# Patient Record
Sex: Female | Born: 1945 | Race: Black or African American | Hispanic: No | Smoking: Never smoker
Health system: Southern US, Community
[De-identification: ages and names within clinical notes are randomized; demographics above are authoritative.]

## PROBLEM LIST (undated history)

## (undated) DIAGNOSIS — Z950 Presence of cardiac pacemaker: Secondary | ICD-10-CM

## (undated) DIAGNOSIS — E119 Type 2 diabetes mellitus without complications: Secondary | ICD-10-CM

## (undated) DIAGNOSIS — N289 Disorder of kidney and ureter, unspecified: Secondary | ICD-10-CM

## (undated) HISTORY — PX: CARDIAC PACEMAKER PLACEMENT: SHX583

---

## 2016-03-13 ENCOUNTER — Encounter (HOSPITAL_BASED_OUTPATIENT_CLINIC_OR_DEPARTMENT_OTHER): Payer: Self-pay | Admitting: Emergency Medicine

## 2016-03-13 DIAGNOSIS — Z794 Long term (current) use of insulin: Secondary | ICD-10-CM | POA: Insufficient documentation

## 2016-03-13 DIAGNOSIS — M5432 Sciatica, left side: Secondary | ICD-10-CM | POA: Diagnosis not present

## 2016-03-13 DIAGNOSIS — G8929 Other chronic pain: Secondary | ICD-10-CM | POA: Diagnosis not present

## 2016-03-13 DIAGNOSIS — Z791 Long term (current) use of non-steroidal anti-inflammatories (NSAID): Secondary | ICD-10-CM | POA: Insufficient documentation

## 2016-03-13 DIAGNOSIS — Z7901 Long term (current) use of anticoagulants: Secondary | ICD-10-CM | POA: Diagnosis not present

## 2016-03-13 DIAGNOSIS — Z992 Dependence on renal dialysis: Secondary | ICD-10-CM | POA: Diagnosis not present

## 2016-03-13 DIAGNOSIS — N186 End stage renal disease: Secondary | ICD-10-CM | POA: Diagnosis not present

## 2016-03-13 DIAGNOSIS — M549 Dorsalgia, unspecified: Secondary | ICD-10-CM | POA: Diagnosis present

## 2016-03-13 DIAGNOSIS — Z79899 Other long term (current) drug therapy: Secondary | ICD-10-CM | POA: Insufficient documentation

## 2016-03-13 DIAGNOSIS — E119 Type 2 diabetes mellitus without complications: Secondary | ICD-10-CM | POA: Insufficient documentation

## 2016-03-13 DIAGNOSIS — M5431 Sciatica, right side: Secondary | ICD-10-CM | POA: Diagnosis not present

## 2016-03-13 LAB — CBG MONITORING, ED: GLUCOSE-CAPILLARY: 140 mg/dL — AB (ref 65–99)

## 2016-03-13 NOTE — ED Notes (Signed)
Patient reports that she is having a Headache and back ache that goes all up her back and her head. Patient reports that she was recently dx with spinal stenosis and went to her MD this am. The patient reports that she was told to come to the ER if it became worse

## 2016-03-14 ENCOUNTER — Emergency Department (HOSPITAL_BASED_OUTPATIENT_CLINIC_OR_DEPARTMENT_OTHER)
Admission: EM | Admit: 2016-03-14 | Discharge: 2016-03-14 | Disposition: A | Discharge status: Home / Self Care | Payer: Medicare Other | Attending: Emergency Medicine | Admitting: Emergency Medicine

## 2016-03-14 DIAGNOSIS — M5431 Sciatica, right side: Secondary | ICD-10-CM

## 2016-03-14 DIAGNOSIS — M5432 Sciatica, left side: Secondary | ICD-10-CM

## 2016-03-14 HISTORY — DX: Type 2 diabetes mellitus without complications: E11.9

## 2016-03-14 HISTORY — DX: Disorder of kidney and ureter, unspecified: N28.9

## 2016-03-14 HISTORY — DX: Presence of cardiac pacemaker: Z95.0

## 2016-03-14 MED ORDER — HYDROCODONE-ACETAMINOPHEN 5-325 MG PO TABS: 0.5000 | ORAL_TABLET | ORAL | Status: AC | PRN

## 2016-03-14 MED ORDER — FENTANYL CITRATE (PF) 100 MCG/2ML IJ SOLN
200.0000 ug | Freq: Once | INTRAMUSCULAR | Status: AC
  Administered 2016-03-14: 200 ug via INTRAMUSCULAR
  Filled 2016-03-14: qty 4

## 2016-03-14 NOTE — ED Provider Notes (Signed)
CSN: [id number]     Arrival date & time 03/13/16  2135 History   First MD Initiated Contact with Patient 03/14/16 0430     Chief Complaint  Patient presents with  . Back Pain     (Consider location/radiation/quality/duration/timing/severity/associated sxs/prior Treatment) HPI  This is a 70 year old female with a stage renal disease on hemodialysis. She has a history of spinal stenosis and chronic back pain with occasional "flareups". She is here with a flareup that began 4 days ago. The pain is most prominent in the lumbar region radiating down the backs of both legs as well as up the spine to her head. The pain is similar to previous exacerbations. The pain is severe enough that she is unable to ambulate; she does have a walker and a motorized wheelchair mobilization assistance. She was seen by her PCP yesterday and was started on steroids. She is not on any narcotic pain medication. She has had multiple back surgeries in the past and is not interested in neurosurgical referral.  Past Medical History  Diagnosis Date  . Renal disorder   . Diabetes mellitus without complication ([place])   . Pacemaker    Past Surgical History  Procedure Laterality Date  . Cardiac pacemaker placement     History reviewed. No pertinent family history. Social History  Substance Use Topics  . Smoking status: Never Smoker   . Smokeless tobacco: None  . Alcohol Use: No   OB History    No data available     Review of Systems  All other systems reviewed and are negative.   Allergies  Morphine and related  Home Medications   Prior to Admission medications   Medication Sig Start Date End Date Taking? Authorizing Provider  acetaminophen-codeine (TYLENOL #4) 300-60 MG tablet Take 1 tablet by mouth every 4 (four) hours as needed for pain.   Yes Historical Provider, MD  allopurinol (ZYLOPRIM) 100 MG tablet Take 100 mg by mouth daily.   Yes Historical Provider, MD  amiodarone (PACERONE) 200 MG tablet Take  200 mg by mouth daily.   Yes Historical Provider, MD  budesonide-formoterol (SYMBICORT) 160-4.5 MCG/ACT inhaler Inhale 2 puffs into the lungs 2 (two) times daily.   Yes Historical Provider, MD  camphor-menthol Timoteo Ace) lotion Apply 1 application topically as needed for itching.   Yes Historical Provider, MD  cyanocobalamin 1000 MCG tablet Take 1,000 mcg by mouth daily.   Yes Historical Provider, MD  diclofenac sodium (VOLTAREN) 1 % GEL Apply topically 4 (four) times daily.   Yes Historical Provider, MD  docusate sodium (COLACE) 100 MG capsule Take 100 mg by mouth 2 (two) times daily.   Yes Historical Provider, MD  gabapentin (NEURONTIN) 300 MG capsule Take 300 mg by mouth 3 (three) times daily.   Yes Historical Provider, MD  hydrALAZINE (APRESOLINE) 25 MG tablet Take 25 mg by mouth 3 (three) times daily.   Yes Historical Provider, MD  Insulin Glargine (TOUJEO SOLOSTAR [place]) Inject into the skin.   Yes Historical Provider, MD  levothyroxine (SYNTHROID, LEVOTHROID) 50 MCG tablet Take 50 mcg by mouth daily before breakfast.   Yes Historical Provider, MD  ondansetron (ZOFRAN-ODT) 4 MG disintegrating tablet Take 4 mg by mouth every 8 (eight) hours as needed for nausea or vomiting.   Yes Historical Provider, MD  pantoprazole (PROTONIX) 40 MG tablet Take 40 mg by mouth daily.   Yes Historical Provider, MD  polyethylene glycol (MIRALAX / GLYCOLAX) packet Take 17 g by mouth daily.   Yes  Historical Provider, MD  pravastatin (PRAVACHOL) 40 MG tablet Take 40 mg by mouth daily.   Yes Historical Provider, MD  predniSONE (DELTASONE) 10 MG tablet Take 20 mg by mouth daily with breakfast.   Yes Historical Provider, MD  ranolazine (RANEXA) 500 MG 12 hr tablet Take 500 mg by mouth 2 (two) times daily.   Yes Historical Provider, MD  sertraline (ZOLOFT) 25 MG tablet Take 25 mg by mouth daily.   Yes Historical Provider, MD  sevelamer carbonate (RENVELA) 800 MG tablet Take 800 mg by mouth 3 (three) times daily with meals.    Yes Historical Provider, MD  warfarin (COUMADIN) 5 MG tablet Take 5 mg by mouth daily.   Yes Historical Provider, MD  HYDROcodone-acetaminophen (NORCO/VICODIN) 5-325 MG tablet Take 0.5-1 tablets by mouth every 4 (four) hours as needed (for pain; may cause constipation). 03/14/16   Keidan Aumiller, MD   BP 173/78 mmHg  Pulse 78  Temp(Src) 98 F (36.7 C)  Resp 18  Ht 5\' 2"  (1.575 m)  Wt 189 lb (85.73 kg)  BMI 34.56 kg/m2  SpO2 100%   Physical Exam  General: Well-developed, well-nourished female in no acute distress; appearance consistent with age of record HENT: normocephalic; atraumatic Eyes: pupils equal, round and reactive to light; extraocular muscles intact; arcus senilis bilaterally Neck: supple Heart: regular rate and rhythm Lungs: clear to auscultation bilaterally Abdomen: soft; nondistended; nontender; bowel sounds present Extremities: No deformity; dialysis fistula left upper arm with pulse and thrill; decreased range of motion to hips due to pain Back: Positive straight leg raise bilaterally at about 10 Neurologic: Awake, alert and oriented; no facial droop; sensation intact and symmetric in lower extremities; motor function present in lower extremities but difficult to assess due to pain Skin: Warm and dry Psychiatric: Normal mood and affect    ED Course  Procedures (including critical care time)   MDM   Nursing notes and vitals signs, including pulse oximetry, reviewed.  Summary of this visit's results, reviewed by myself:  Labs:  Results for orders placed or performed during the hospital encounter of 03/14/16 (from the past 24 hour(s))  CBG monitoring, ED     Status: Abnormal   Collection Time: 03/13/16  9:45 PM  Result Value Ref Range   Glucose-Capillary 140 (H) 65 - 99 mg/dL    Final diagnoses:  Bilateral sciatica      Shanon Rosser, MD 03/14/16 [phone]

## 2016-03-14 NOTE — ED Notes (Signed)
Pt c/o back pain radiating to bilateral legs onset Saturday  States unable to stand,  Was see by her md yesterday and started on predisone

## 2020-01-06 ENCOUNTER — Encounter (HOSPITAL_COMMUNITY): Payer: Self-pay | Admitting: Emergency Medicine

## 2020-01-06 ENCOUNTER — Emergency Department (HOSPITAL_COMMUNITY): Payer: Medicare Other

## 2020-01-06 ENCOUNTER — Emergency Department (HOSPITAL_COMMUNITY)
Admission: EM | Admit: 2020-01-06 | Discharge: 2020-01-06 | Disposition: A | Discharge status: Home / Self Care | Payer: Medicare Other | Attending: Emergency Medicine | Admitting: Emergency Medicine

## 2020-01-06 ENCOUNTER — Other Ambulatory Visit: Payer: Self-pay

## 2020-01-06 DIAGNOSIS — Z7901 Long term (current) use of anticoagulants: Secondary | ICD-10-CM | POA: Diagnosis not present

## 2020-01-06 DIAGNOSIS — Z79899 Other long term (current) drug therapy: Secondary | ICD-10-CM | POA: Diagnosis not present

## 2020-01-06 DIAGNOSIS — U071 COVID-19: Secondary | ICD-10-CM | POA: Insufficient documentation

## 2020-01-06 DIAGNOSIS — R410 Disorientation, unspecified: Secondary | ICD-10-CM | POA: Insufficient documentation

## 2020-01-06 DIAGNOSIS — R52 Pain, unspecified: Secondary | ICD-10-CM

## 2020-01-06 DIAGNOSIS — N186 End stage renal disease: Secondary | ICD-10-CM | POA: Insufficient documentation

## 2020-01-06 DIAGNOSIS — E1122 Type 2 diabetes mellitus with diabetic chronic kidney disease: Secondary | ICD-10-CM | POA: Diagnosis not present

## 2020-01-06 DIAGNOSIS — Z89511 Acquired absence of right leg below knee: Secondary | ICD-10-CM | POA: Diagnosis not present

## 2020-01-06 DIAGNOSIS — E11649 Type 2 diabetes mellitus with hypoglycemia without coma: Secondary | ICD-10-CM | POA: Insufficient documentation

## 2020-01-06 DIAGNOSIS — Z992 Dependence on renal dialysis: Secondary | ICD-10-CM | POA: Diagnosis not present

## 2020-01-06 DIAGNOSIS — Z794 Long term (current) use of insulin: Secondary | ICD-10-CM | POA: Insufficient documentation

## 2020-01-06 DIAGNOSIS — E162 Hypoglycemia, unspecified: Secondary | ICD-10-CM

## 2020-01-06 DIAGNOSIS — R4182 Altered mental status, unspecified: Secondary | ICD-10-CM | POA: Diagnosis present

## 2020-01-06 LAB — CBG MONITORING, ED
Glucose-Capillary: 52 mg/dL — ABNORMAL LOW (ref 70–99)
Glucose-Capillary: 68 mg/dL — ABNORMAL LOW (ref 70–99)
Glucose-Capillary: 43 mg/dL — CL (ref 70–99)
Glucose-Capillary: 44 mg/dL — CL (ref 70–99)
Glucose-Capillary: 100 mg/dL — ABNORMAL HIGH (ref 70–99)
Glucose-Capillary: 77 mg/dL (ref 70–99)
Glucose-Capillary: 84 mg/dL (ref 70–99)
Glucose-Capillary: 86 mg/dL (ref 70–99)

## 2020-01-06 LAB — CBC WITH DIFFERENTIAL/PLATELET
Abs Immature Granulocytes: 0.03 10*3/uL (ref 0.00–0.07)
Basophils Absolute: 0 10*3/uL (ref 0.0–0.1)
Basophils Relative: 0 %
Eosinophils Absolute: 0.1 10*3/uL (ref 0.0–0.5)
Eosinophils Relative: 1 %
HCT: 32.9 % — ABNORMAL LOW (ref 36.0–46.0)
Hemoglobin: 9.6 g/dL — ABNORMAL LOW (ref 12.0–15.0)
Immature Granulocytes: 0 %
Lymphocytes Relative: 10 %
Lymphs Abs: 0.8 10*3/uL (ref 0.7–4.0)
MCH: 26.4 pg (ref 26.0–34.0)
MCHC: 29.2 g/dL — ABNORMAL LOW (ref 30.0–36.0)
MCV: 90.4 fL (ref 80.0–100.0)
Monocytes Absolute: 0.4 10*3/uL (ref 0.1–1.0)
Monocytes Relative: 6 %
Neutro Abs: 6.1 10*3/uL (ref 1.7–7.7)
Neutrophils Relative %: 83 %
Platelets: 175 10*3/uL (ref 150–400)
RBC: 3.64 MIL/uL — ABNORMAL LOW (ref 3.87–5.11)
RDW: 22.2 % — ABNORMAL HIGH (ref 11.5–15.5)
WBC: 7.4 10*3/uL (ref 4.0–10.5)
nRBC: 0 % (ref 0.0–0.2)

## 2020-01-06 LAB — RESPIRATORY PANEL BY RT PCR (FLU A&B, COVID)
Influenza A by PCR: NEGATIVE
Influenza B by PCR: NEGATIVE
SARS Coronavirus 2 by RT PCR: POSITIVE — AB

## 2020-01-06 LAB — HEPATIC FUNCTION PANEL
ALT: 11 U/L (ref 0–44)
AST: 32 U/L (ref 15–41)
Albumin: 2.3 g/dL — ABNORMAL LOW (ref 3.5–5.0)
Alkaline Phosphatase: 63 U/L (ref 38–126)
Bilirubin, Direct: 0.3 mg/dL — ABNORMAL HIGH (ref 0.0–0.2)
Indirect Bilirubin: 0.3 mg/dL (ref 0.3–0.9)
Total Bilirubin: 0.6 mg/dL (ref 0.3–1.2)
Total Protein: 5.7 g/dL — ABNORMAL LOW (ref 6.5–8.1)

## 2020-01-06 LAB — LIPASE, BLOOD: Lipase: 22 U/L (ref 11–51)

## 2020-01-06 LAB — BASIC METABOLIC PANEL
Anion gap: 12 (ref 5–15)
BUN: 26 mg/dL — ABNORMAL HIGH (ref 8–23)
CO2: 25 mmol/L (ref 22–32)
Calcium: 9.4 mg/dL (ref 8.9–10.3)
Chloride: 104 mmol/L (ref 98–111)
Creatinine, Ser: 7.44 mg/dL — ABNORMAL HIGH (ref 0.44–1.00)
GFR calc Af Amer: 6 mL/min — ABNORMAL LOW (ref >60)
GFR calc non Af Amer: 5 mL/min — ABNORMAL LOW (ref >60)
Glucose, Bld: 55 mg/dL — ABNORMAL LOW (ref 70–99)
Potassium: 5 mmol/L (ref 3.5–5.1)
Sodium: 141 mmol/L (ref 135–145)

## 2020-01-06 MED ORDER — DEXTROSE 50 % IV SOLN: 50.0000 mL | Freq: Once | INTRAVENOUS | Status: AC

## 2020-01-06 MED ORDER — DEXTROSE 50 % IV SOLN
INTRAVENOUS | Status: AC
  Filled 2020-01-06: qty 50

## 2020-01-06 MED ORDER — GLUCOSE 40 % PO GEL
ORAL | Status: AC
  Administered 2020-01-06: 11:00:00 37.5 g
  Filled 2020-01-06: qty 1

## 2020-01-06 MED ORDER — DEXTROSE 50 % IV SOLN
INTRAVENOUS | Status: AC
  Administered 2020-01-06: 11:00:00 50 mL via INTRAVENOUS
  Filled 2020-01-06: qty 50

## 2020-01-06 NOTE — Discharge Instructions (Signed)
Please do not give patient diabetic medications.  Her blood sugar was low and likely due to her medications.  Make sure she continues to eat and drink.  Talk with primary care doctor about continuing blood sugar medications.  Please check her blood sugar every several hours.

## 2020-01-06 NOTE — ED Notes (Signed)
Pt dc'd to NH, transported via [employer]

## 2020-01-06 NOTE — ED Provider Notes (Signed)
[facility] EMERGENCY DEPARTMENT Provider Note   CSN: [id number] Arrival date & time: 01/06/20  0846     History Chief Complaint  Patient presents with  . Altered Mental Status    Martha Murray is a 74 y.o. female.  Per nursing home patient became slightly more altered after morning medications this morning.  These medications did not include any diabetes medicine or pain medicine.  She appeared maybe not to be moving her right arm.  EMS found her to have a blood sugar of 69.  Gave her glucagon.  Upon EMS evaluation she is moving all of her extremities and talking but she appears mildly confused.  Patient does not make urine.  She has been on 3 L of oxygen since being diagnosed with coronavirus about 3 weeks ago.  Patient denies any chest pain, shortness of breath.  Patient is supposed to go to dialysis today.  The history is provided by the patient.  Altered Mental Status Presenting symptoms: confusion and partial responsiveness   Severity:  Mild Most recent episode:  Today Episode history:  Single Timing:  Constant Progression:  Improving Chronicity:  New Context comment:  ESRD on HD, Insulin dependent diabtes found lethargic by rehab staff. Blood sugar with EMS 69. Associated symptoms: no abdominal pain, no fever, no palpitations, no rash, no seizures and no vomiting        Past Medical History:  Diagnosis Date  . Diabetes mellitus without complication ([place])   . Pacemaker   . Renal disorder     There are no problems to display for this patient.   Past Surgical History:  Procedure Laterality Date  . CARDIAC PACEMAKER PLACEMENT       OB History   No obstetric history on file.     No family history on file.  Social History   Tobacco Use  . Smoking status: Never Smoker  . Smokeless tobacco: Never Used  Substance Use Topics  . Alcohol use: No  . Drug use: No    Home Medications Prior to Admission medications   Medication Sig Start Date  End Date Taking? Authorizing Provider  acetaminophen-codeine (TYLENOL #4) 300-60 MG tablet Take 1 tablet by mouth every 4 (four) hours as needed for pain.    [provider]  allopurinol (ZYLOPRIM) 100 MG tablet Take 100 mg by mouth daily.    [provider]  amiodarone (PACERONE) 200 MG tablet Take 200 mg by mouth daily.    [provider]  budesonide-formoterol (SYMBICORT) 160-4.5 MCG/ACT inhaler Inhale 2 puffs into the lungs 2 (two) times daily.    [provider]  camphor-menthol Timoteo Ace) lotion Apply 1 application topically as needed for itching.    [provider]  cyanocobalamin 1000 MCG tablet Take 1,000 mcg by mouth daily.    [provider]  diclofenac sodium (VOLTAREN) 1 % GEL Apply topically 4 (four) times daily.    [provider]  docusate sodium (COLACE) 100 MG capsule Take 100 mg by mouth 2 (two) times daily.    [provider]  gabapentin (NEURONTIN) 300 MG capsule Take 300 mg by mouth 3 (three) times daily.    [provider]  hydrALAZINE (APRESOLINE) 25 MG tablet Take 25 mg by mouth 3 (three) times daily.    [provider]  HYDROcodone-acetaminophen (NORCO/VICODIN) 5-325 MG tablet Take 0.5-1 tablets by mouth every 4 (four) hours as needed (for pain; may cause constipation). 03/14/16   Molpus, Jenny Reichmann, MD  Insulin Glargine (  TOUJEO SOLOSTAR [place]) Inject into the skin.    [provider]  levothyroxine (SYNTHROID, LEVOTHROID) 50 MCG tablet Take 50 mcg by mouth daily before breakfast.    [provider]  ondansetron (ZOFRAN-ODT) 4 MG disintegrating tablet Take 4 mg by mouth every 8 (eight) hours as needed for nausea or vomiting.    [provider]  pantoprazole (PROTONIX) 40 MG tablet Take 40 mg by mouth daily.    [provider]  polyethylene glycol (MIRALAX / GLYCOLAX) packet Take 17 g by mouth daily.    [provider]  pravastatin (PRAVACHOL) 40 MG  tablet Take 40 mg by mouth daily.    [provider]  predniSONE (DELTASONE) 10 MG tablet Take 20 mg by mouth daily with breakfast.    [provider]  ranolazine (RANEXA) 500 MG 12 hr tablet Take 500 mg by mouth 2 (two) times daily.    [provider]  sertraline (ZOLOFT) 25 MG tablet Take 25 mg by mouth daily.    [provider]  sevelamer carbonate (RENVELA) 800 MG tablet Take 800 mg by mouth 3 (three) times daily with meals.    [provider]  warfarin (COUMADIN) 5 MG tablet Take 5 mg by mouth daily.    [provider]    Allergies    Morphine and related  Review of Systems   Review of Systems  Constitutional: Negative for chills and fever.  HENT: Negative for ear pain and sore throat.   Eyes: Negative for pain and visual disturbance.  Respiratory: Negative for cough and shortness of breath.   Cardiovascular: Negative for chest pain and palpitations.  Gastrointestinal: Negative for abdominal pain and vomiting.  Genitourinary: Negative for dysuria and hematuria.  Musculoskeletal: Negative for arthralgias and back pain.  Skin: Positive for wound (chronic left foot wound). Negative for color change and rash.  Neurological: Negative for seizures and syncope.  Psychiatric/Behavioral: Positive for confusion.  All other systems reviewed and are negative.   Physical Exam Updated Vital Signs  ED Triage Vitals  Enc Vitals Group     BP --      Pulse --      Resp --      Temp --      Temp src --      SpO2 01/06/20 0919 100 %     Weight --      Height --      Head Circumference --      Peak Flow --      Pain Score 01/06/20 0915 3     Pain Loc --      Pain Edu? --      Excl. in [place]? --     Physical Exam Vitals and nursing note reviewed.  Constitutional:      General: She is not in acute distress.    Appearance: She is well-developed. She is not ill-appearing.  HENT:     Head: Normocephalic and atraumatic.     Nose:  Nose normal.     Mouth/Throat:     Mouth: Mucous membranes are moist.  Eyes:     Extraocular Movements: Extraocular movements intact.     Conjunctiva/sclera: Conjunctivae normal.     Pupils: Pupils are equal, round, and reactive to light.  Cardiovascular:     Rate and Rhythm: Normal rate and regular rhythm.     Heart sounds: Normal heart sounds. No murmur.  Pulmonary:     Effort: Pulmonary effort is normal. No  respiratory distress.     Breath sounds: Normal breath sounds.  Abdominal:     General: Abdomen is flat.     Palpations: Abdomen is soft.     Tenderness: There is no abdominal tenderness.  Musculoskeletal:     Cervical back: Normal range of motion and neck supple.     Comments: Right BKA  Skin:    General: Skin is warm and dry.     Capillary Refill: Capillary refill takes less than 2 seconds.     Comments: Chronic appearing left heel ulcer, right bka with wound dehiscence with a little bit of drainage but overall no surrounding cellulitis  Neurological:     Mental Status: She is alert.     Comments: Patient appears to move her upper extremities with good strength, left lower extremity with mild weakness but she states that is chronic as she is wheelchair-bound, normal sensation, normal finger-to-nose finger but slowed, pupils are equal and reactive, normal speech, has some confusion with the year but can tell me her name clearly  Psychiatric:        Mood and Affect: Mood normal.     ED Results / Procedures / Treatments   Labs (all labs ordered are listed, but only abnormal results are displayed) Labs Reviewed  RESPIRATORY PANEL BY RT PCR (FLU A&B, COVID) - Abnormal; Notable for the following components:      Result Value   SARS Coronavirus 2 by RT PCR POSITIVE (*)    All other components within normal limits  CBC WITH DIFFERENTIAL/PLATELET - Abnormal; Notable for the following components:   RBC 3.64 (*)    Hemoglobin 9.6 (*)    HCT 32.9 (*)    MCHC 29.2 (*)    RDW  22.2 (*)    All other components within normal limits  BASIC METABOLIC PANEL - Abnormal; Notable for the following components:   Glucose, Bld 55 (*)    BUN 26 (*)    Creatinine, Ser 7.44 (*)    GFR calc non Af Amer 5 (*)    GFR calc Af Amer 6 (*)    All other components within normal limits  HEPATIC FUNCTION PANEL - Abnormal; Notable for the following components:   Total Protein 5.7 (*)    Albumin 2.3 (*)    Bilirubin, Direct 0.3 (*)    All other components within normal limits  CBG MONITORING, ED - Abnormal; Notable for the following components:   Glucose-Capillary 52 (*)    All other components within normal limits  CBG MONITORING, ED - Abnormal; Notable for the following components:   Glucose-Capillary 68 (*)    All other components within normal limits  CBG MONITORING, ED - Abnormal; Notable for the following components:   Glucose-Capillary 43 (*)    All other components within normal limits  CBG MONITORING, ED - Abnormal; Notable for the following components:   Glucose-Capillary 44 (*)    All other components within normal limits  CBG MONITORING, ED - Abnormal; Notable for the following components:   Glucose-Capillary 100 (*)    All other components within normal limits  LIPASE, BLOOD  CBG MONITORING, ED  CBG MONITORING, ED    EKG EKG Interpretation  Date/Time:  Wednesday January 06 2020 10:24:56 EST Ventricular Rate:  74 PR Interval:    QRS Duration: 98 QT Interval:  536 QTC Calculation: 595 R Axis:   0 Text Interpretation: Accelerated junctional rhythm Borderline T abnormalities, diffuse leads Prolonged QT interval Confirmed by [place] Skye,  Cristina Ceniceros [phone]) on 01/06/2020 11:37:05 AM   Radiology DG Chest 1 View  Result Date: 01/06/2020 CLINICAL DATA:  Lethargy. EXAM: CHEST  1 VIEW COMPARISON:  December 22, 2019. FINDINGS: The heart size and mediastinal contours are within normal limits. Status post coronary bypass graft. Right-sided pacemaker is unchanged in position.  No pneumothorax or pleural effusion is noted. Both lungs are clear. The visualized skeletal structures are unremarkable. IMPRESSION: No active disease. Electronically Signed   By: Marijo Conception M.D.   On: 01/06/2020 10:08   CT Head Wo Contrast  Result Date: 01/06/2020 CLINICAL DATA:  Altered mental status. EXAM: CT HEAD WITHOUT CONTRAST TECHNIQUE: Contiguous axial images were obtained from the base of the skull through the vertex without intravenous contrast. COMPARISON:  December 22, 2019. FINDINGS: Brain: No evidence of acute infarction, hemorrhage, hydrocephalus, extra-axial collection or mass lesion/mass effect. Vascular: No hyperdense vessel or unexpected calcification. Skull: Normal. Negative for fracture or focal lesion. Sinuses/Orbits: No acute finding. Other: Small right frontal scalp hematoma is noted. IMPRESSION: Small right frontal scalp hematoma. No acute intracranial abnormality seen. Electronically Signed   By: Marijo Conception M.D.   On: 01/06/2020 09:54   DG Foot Complete Left  Result Date: 01/06/2020 CLINICAL DATA:  Nonhealing wound. EXAM: LEFT FOOT - COMPLETE 3+ VIEW COMPARISON:  None. FINDINGS: No acute bony findings or destructive bony changes are identified. Degenerative changes are noted. Extensive vascular calcifications. IMPRESSION: No acute bony findings or destructive bony changes. Electronically Signed   By: Marijo Sanes M.D.   On: 01/06/2020 10:15    Procedures Procedures (including critical care time)  Medications Ordered in ED Medications  dextrose 50 % solution (has no administration in time range)  dextrose (GLUTOSE) 40 % oral gel (37.5 g  Given 01/06/20 1036)  dextrose 50 % solution 50 mL (50 mLs Intravenous Given 01/06/20 1120)   EMERGENCY DEPARTMENT  US GUIDANCE EXAM Emergency Ultrasound:  US Guidance for Needle Guidance  INDICATIONS: Difficult vascular access Linear probe used in real-time to visualize location of needle entry through skin.   PERFORMED  BY: Myself IMAGES ARCHIVED?: No LIMITATIONS: poor vascular VIEWS USED: Transverse INTERPRETATION: Needle visualized within vein and Right arm ED Course  I have reviewed the triage vital signs and the nursing notes.  Pertinent labs & imaging results that were available during my care of the patient were reviewed by me and considered in my medical decision making (see chart for details).    MDM Rules/Calculators/A&P  Kaileah Albee is a 74 year old female with history of diabetes, ESRD on hemodialysis, status post right BKA who presents to the ED with altered mental status.  Patient with unremarkable vitals.  No fever.  On 3 L of oxygen since recent Covid diagnosis several weeks ago.  Came from rehab center being lethargic this morning.  Concern for may be new right upper extremity weakness however appears to be moving all extremities fairly equally on exam.  Appears to be generally weak.  Otherwise no obvious stroke symptoms.  Patient is due for dialysis today.  Blood sugar was 69 with EMS and they gave glucagon.  Blood sugar upon arrival here is 52.  She was given juice and crackers.  Blood sugar improved again to 68.  Morning medications were given per nursing staff at facility however this did not include diabetes medicine or pain medicine.  She appears mildly confused when she answers questions but as previously stated she appears to not have any focal stroke findings.  She  did have a fall possibly last week and has a small hematoma to her right forehead.  Overall possibly metabolic process or possibly symptoms secondary to needing dialysis.  Will get lab work including chest x-ray, EKG, CT head.  Will reevaluate blood sugar.  Patient does not make any urine.  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  Patient is on glipizide for diabetes.  Following D50 patient had great improvement of her mental status.  Blood sugar was as low as 44.  IV access was difficult.  Lab work showed  no significant anemia, electrolyte abnormality, kidney injury.  Creatinine appears to be around baseline.  Electrolytes are unremarkable.  Following D50 blood sugar is 100.  CT scan of her head was unremarkable.  X-ray of the left foot showed no osteomyelitis.  No concern for acute infectious process.  Suspect that symptoms were secondary to poor p.o. intake and hypoglycemia.  Patient will eat and drink and will recheck blood sugar and anticipate discharge back to facility.  She remains positive for Covid.  However does not have any severe respiratory symptoms.  Patient with stable glucose x2 at 77 and 84.  Patient continues to have good mentation.  Overall suspect hypoglycemia secondary to medication.  Recommend that she not take any diabetes medicine, glipizide, until she talks her primary care doctor.  Made facility aware of this as well.  This chart was dictated using voice recognition software.  Despite best efforts to proofread,  errors can occur which can change the documentation meaning.    Final Clinical Impression(s) / ED Diagnoses Final diagnoses:  Confusion  Hypoglycemia    Rx / DC Orders ED Discharge Orders    None       Lennice Sites, DO 01/06/20 1559

## 2020-01-06 NOTE — ED Notes (Signed)
Pt's CBG result was 86. Informed Magda Paganini - RN.

## 2020-01-06 NOTE — ED Triage Notes (Signed)
Per ems  Pt from nursing home EMS called for lethargic and not responding,  Pt is dialysis pt left arm graft  Left AKA cbg upon ems arrival 69  Upon pts arrival to ER pts blood sugar 52 , pt given OJ x 2 , pt now responding to Dr C  Pt is COVID positive 12/27

## 2020-01-06 NOTE — ED Notes (Signed)
Save blood sent to lab in addition to ordered specimens: 2 gold tops, 1 dk green, blue

## 2020-01-06 NOTE — ED Notes (Signed)
[facility] called for transport.  

## 2020-01-07 ENCOUNTER — Telehealth: Payer: Self-pay | Admitting: Nurse Practitioner

## 2020-01-07 NOTE — Telephone Encounter (Signed)
Called to Discuss with patient about Covid symptoms and the use of bamlanivimab, a monoclonal antibody infusion for those with mild to moderate Covid symptoms and at a high risk of hospitalization.  Patient not available as she is residing in a nursing facility but I spoke to her daughter.   Patient does not qualify for infusion based on increased/new oxygen requirement.    Information regarding covid provided. Emotional support provided. Advised daughter to have patient to go to Urgent care or ED with severe symptoms.

## 2020-02-22 DEATH — deceased

## 2021-07-26 IMAGING — CT CT HEAD W/O CM
4 series · 14 of 47 positions shown, 16 images · non-contrast
Comparison: December 22, 2019.

CLINICAL DATA: Altered mental status.

EXAM:
CT HEAD WITHOUT CONTRAST
TECHNIQUE: Contiguous axial images were obtained from the base of the skull
through the vertex without intravenous contrast.

[Series 2: head wo · axial · 0.34mm/px · z∈[-129,-15]mm · 6 of 35 slices shown, 8 images]
[im 5/35  brain]
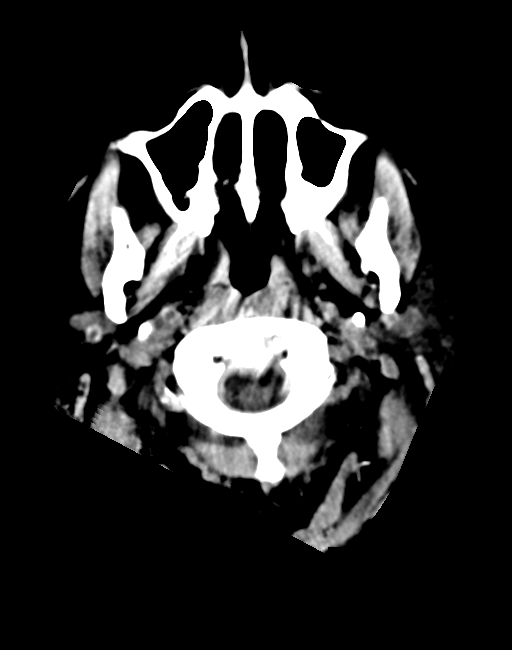
[im 5/35  bone]
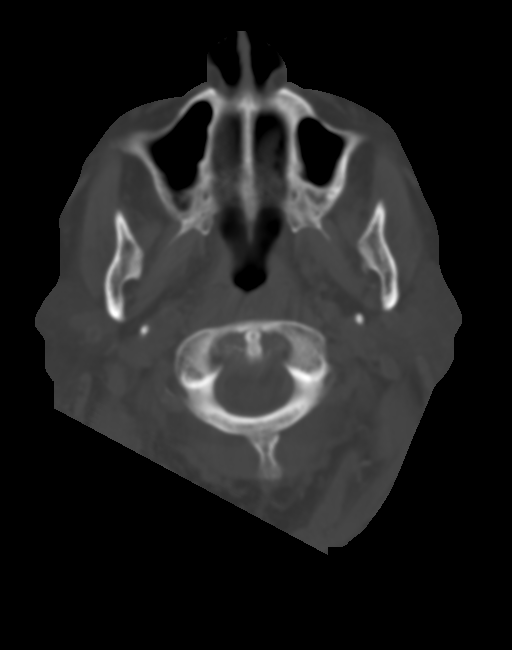
[im 10/35  brain]
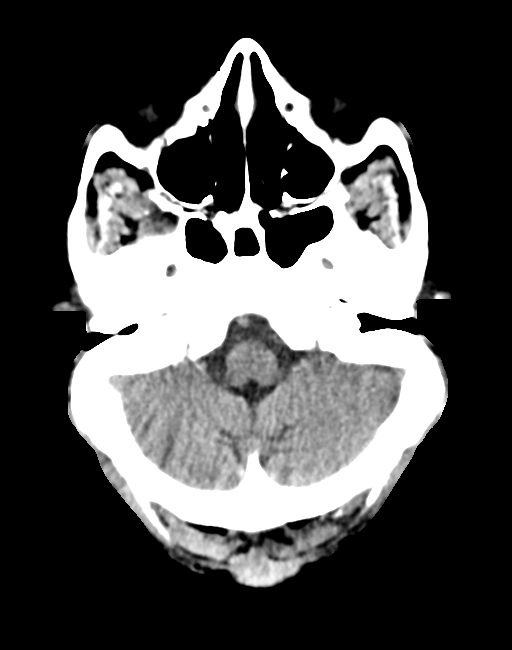
[im 15/35  brain]
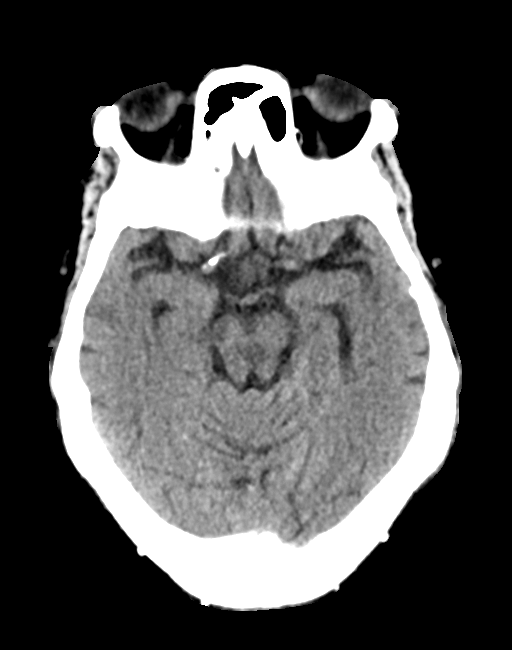
[im 20/35  brain]
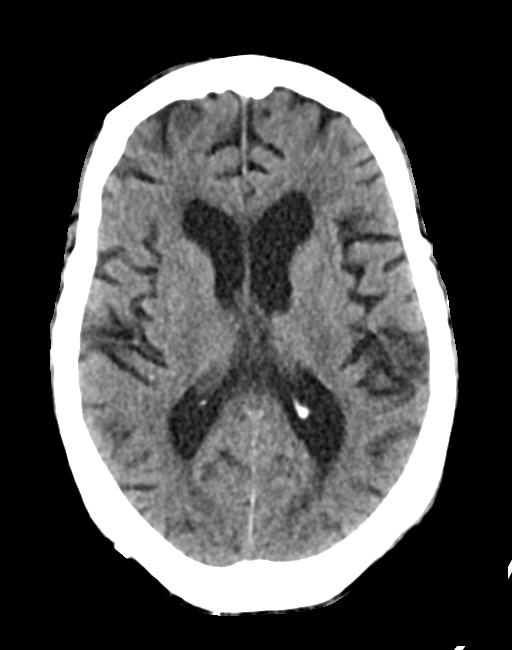
[im 25/35  brain]
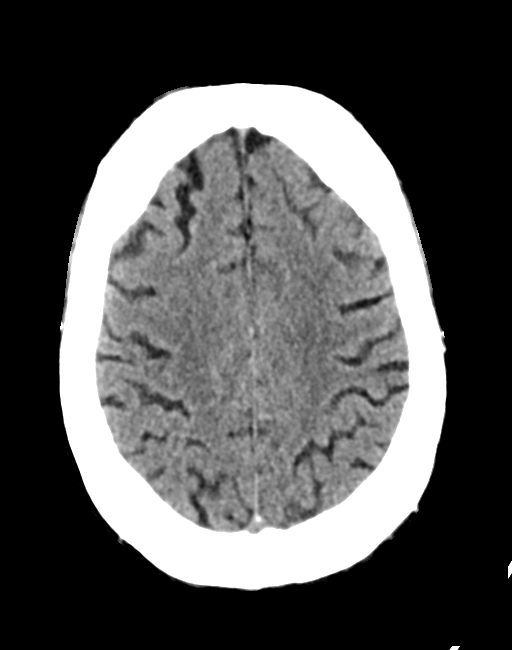
[im 25/35  bone]
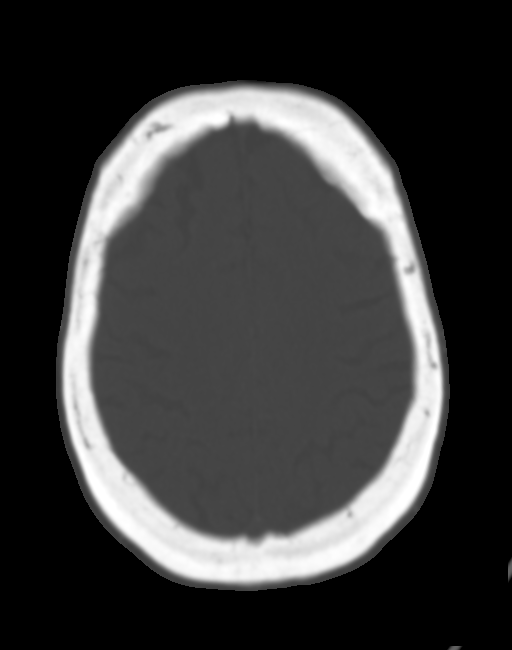
[im 30/35  brain]
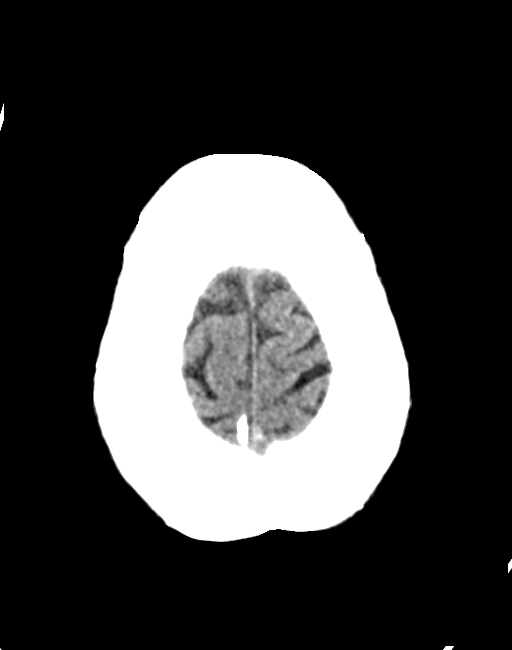

[Series 3: head bone · axial · 0.34mm/px · z∈[-139,-122]mm · 2 of 92 slices shown]
[im 9/92  bone]
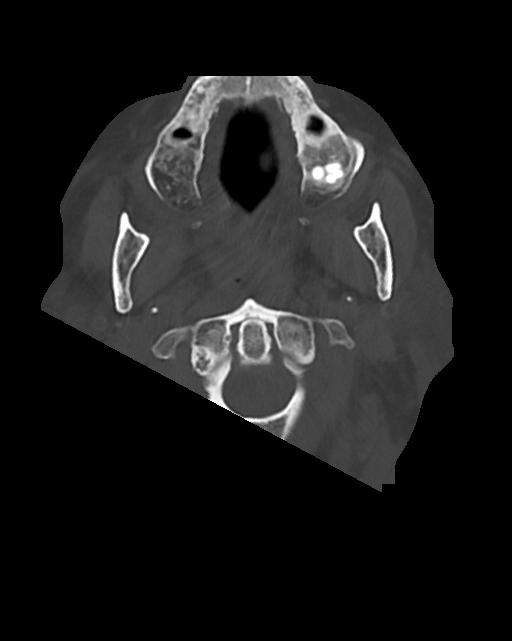
[im 18/92  bone]
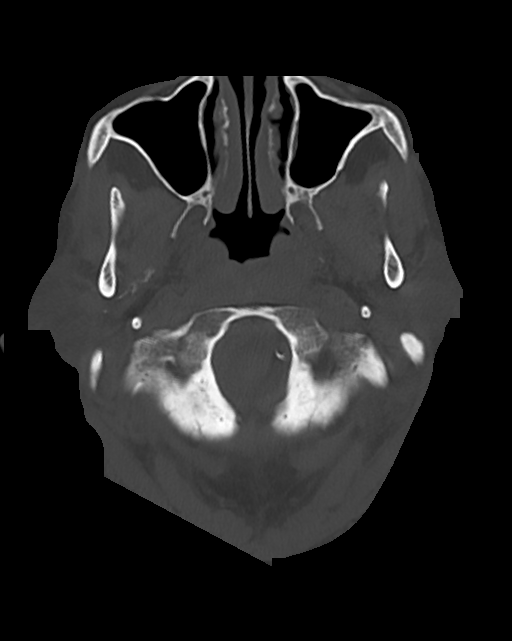

[Series 4: cor soft · coronal · 0.34mm/px · 3 of 72 slices shown]
[im 24/72  brain]
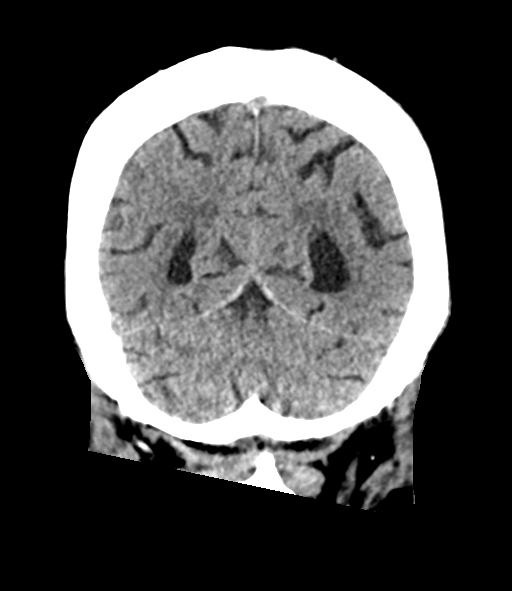
[im 32/72  brain]
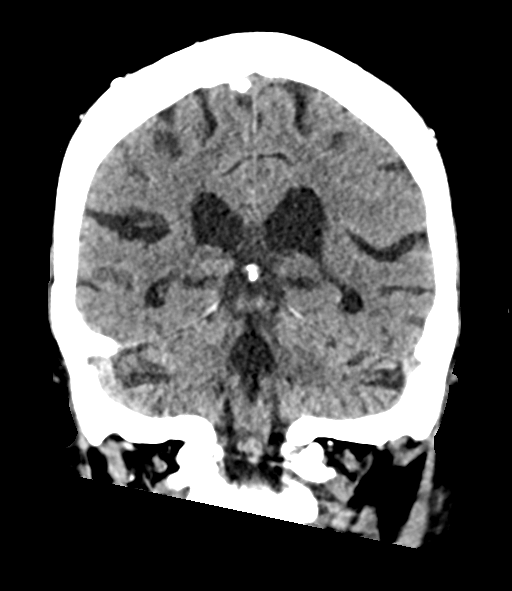
[im 40/72  brain]
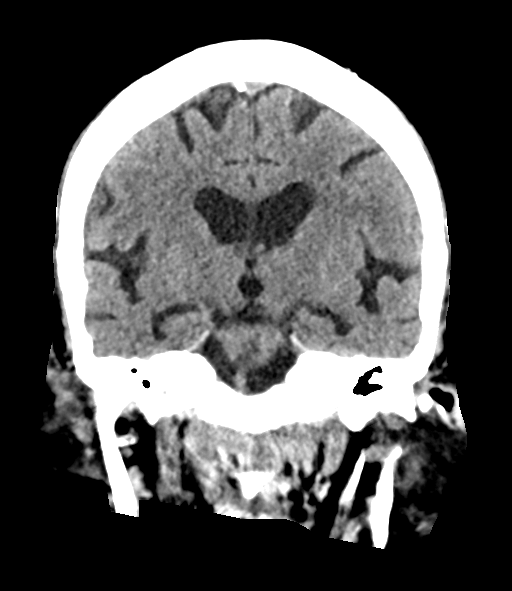

[Series 5: sag soft · sagittal · 0.39mm/px · 3 of 58 slices shown]
[im 24/58  brain]
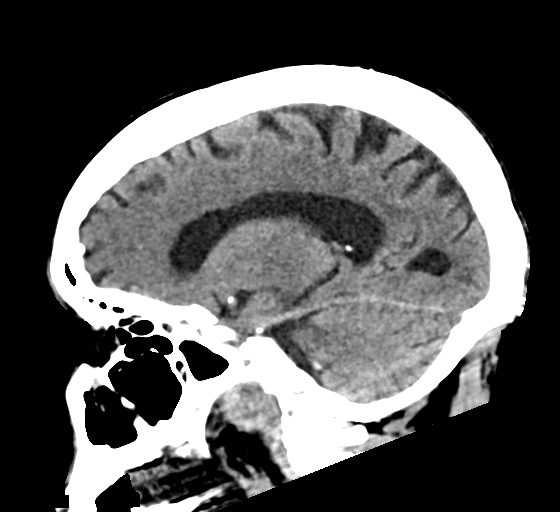
[im 29/58  brain]
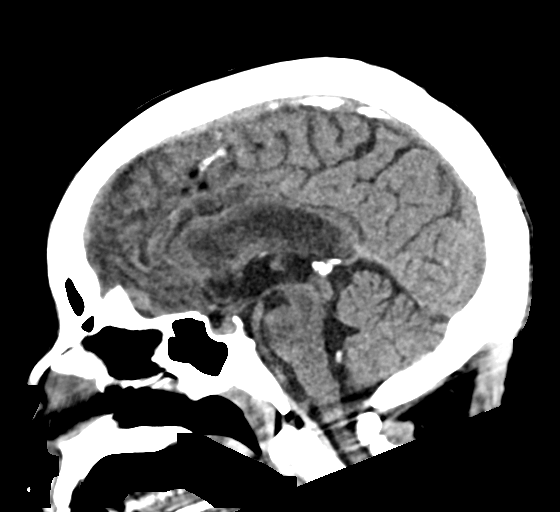
[im 35/58  brain]
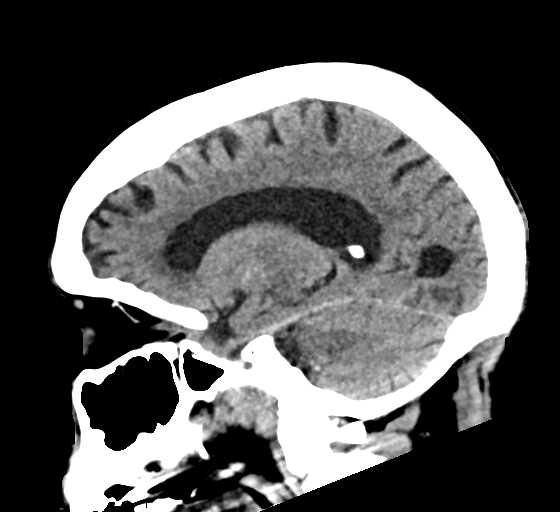

[14 of 47 positions shown; findings below may reference images not displayed]

FINDINGS: Brain: No evidence of acute infarction, hemorrhage, hydrocephalus,
extra-axial collection or mass lesion/mass effect.

Vascular: No hyperdense vessel or unexpected calcification.

Skull: Normal. Negative for fracture or focal lesion.

Sinuses/Orbits: No acute finding.

Other: Small right frontal scalp hematoma is noted.
IMPRESSION: Small right frontal scalp hematoma. No acute intracranial
abnormality seen.

## 2021-07-26 IMAGING — DX DG FOOT COMPLETE 3+V*L*
3 series · 3 of 3 positions shown · non-contrast
Comparison: None.

CLINICAL DATA: Nonhealing wound.

EXAM:
LEFT FOOT - COMPLETE 3+ VIEW

[foot ap]
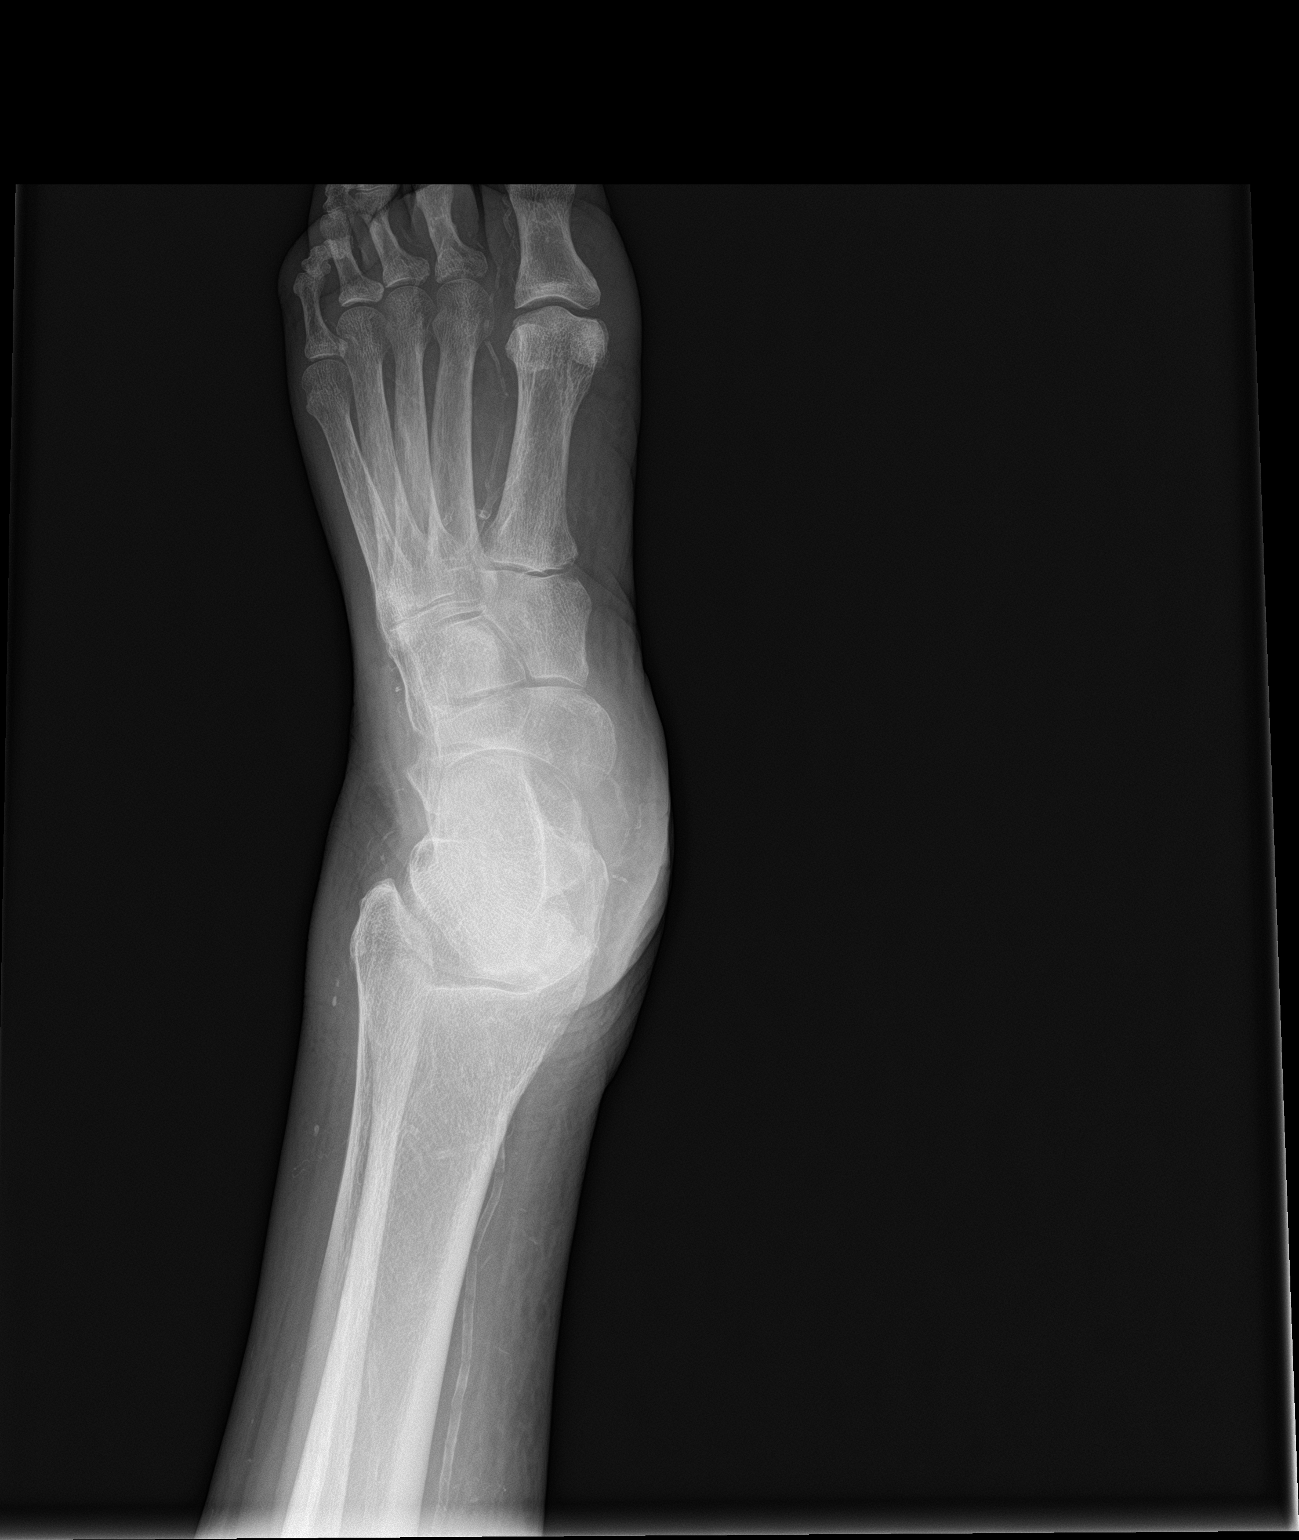

[foot obl]
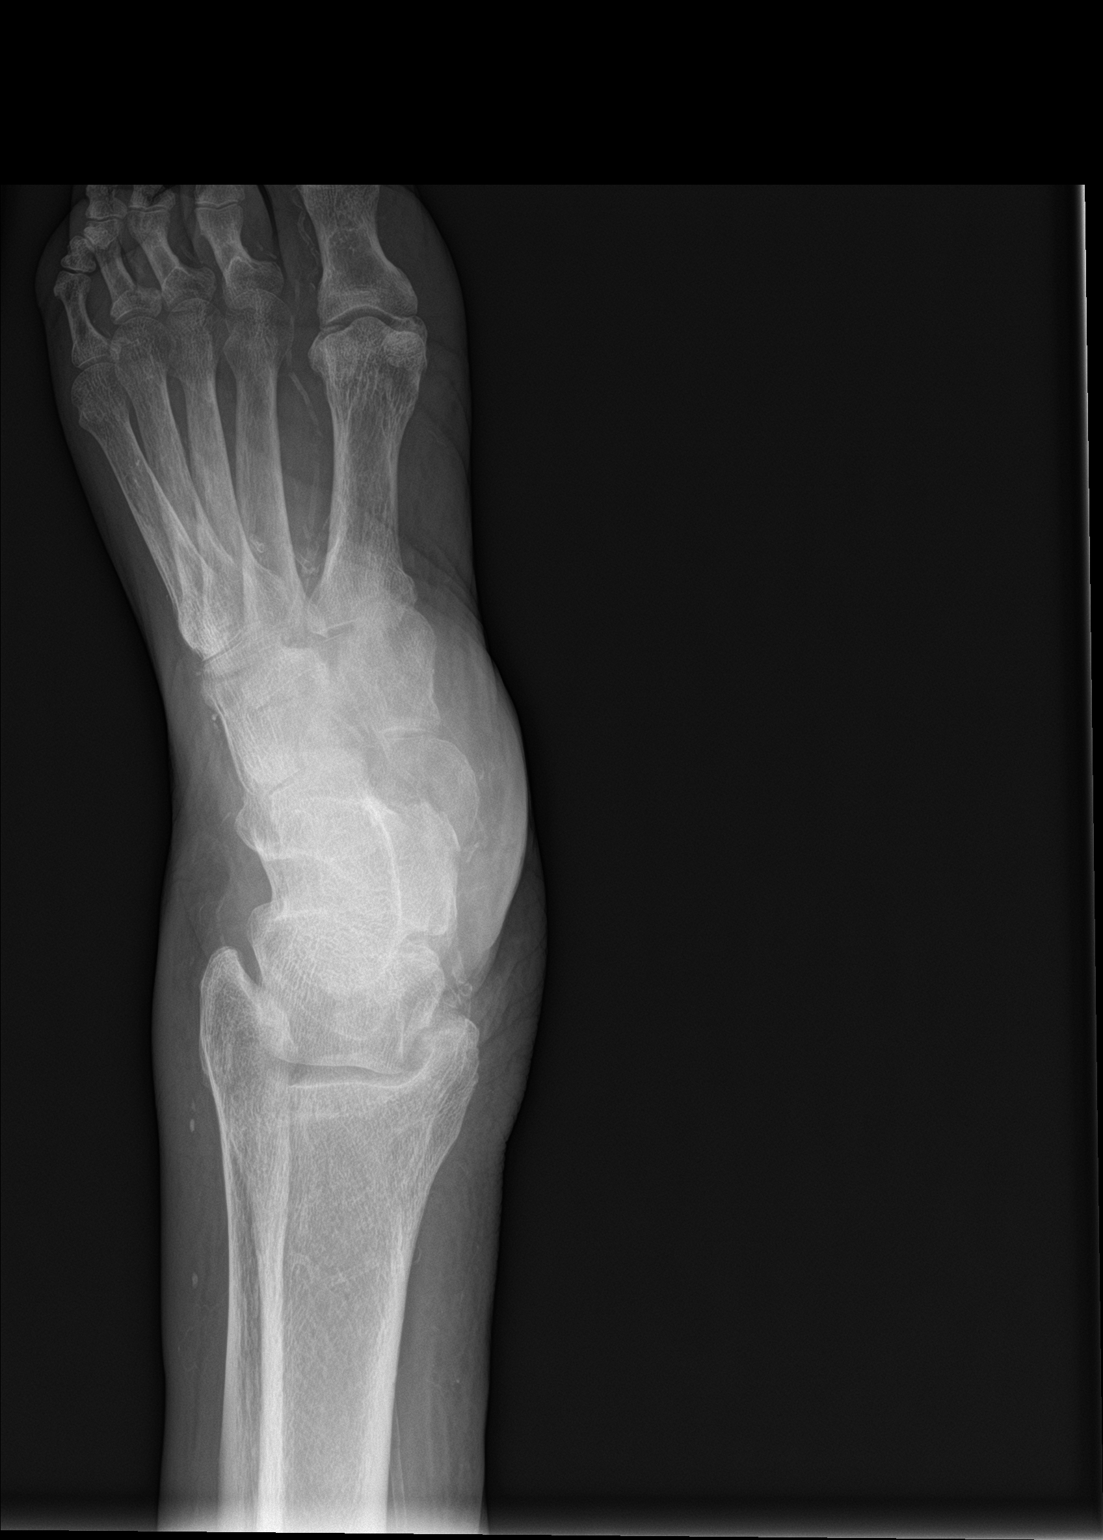

[foot lat]
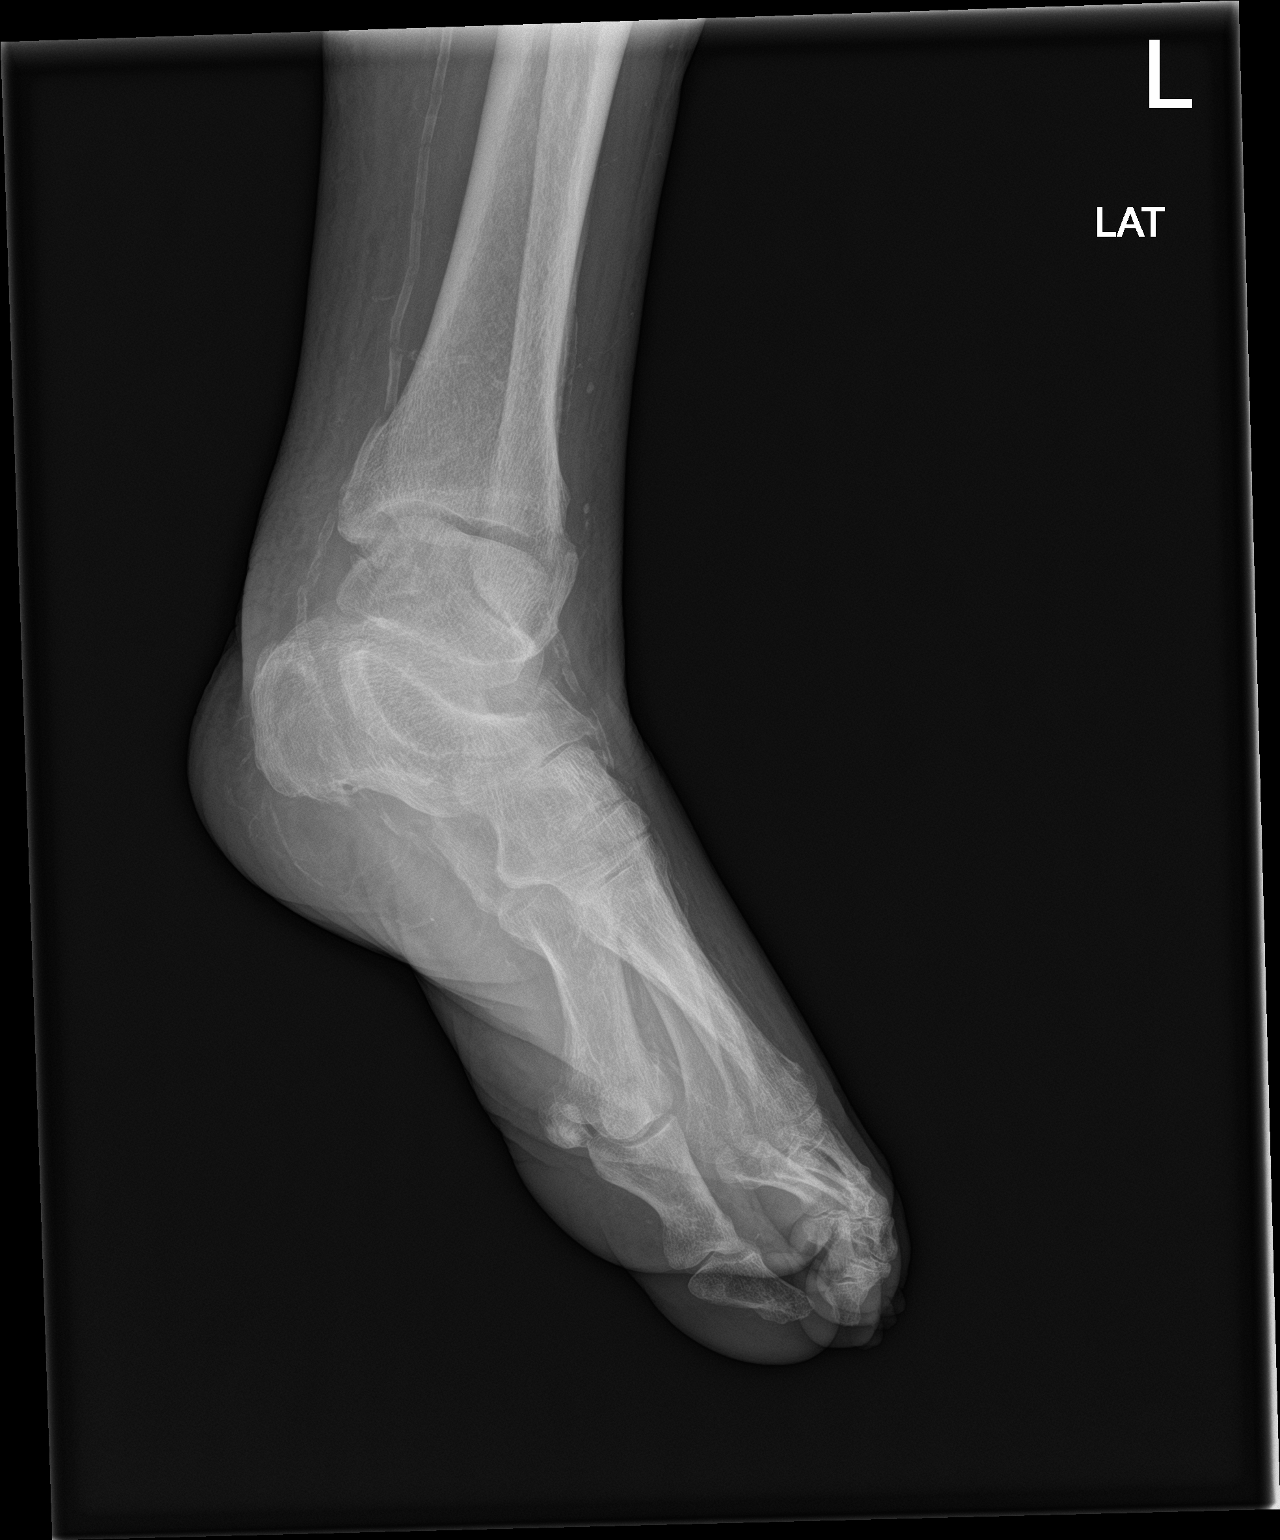

[3 of 3 positions shown; findings below may reference images not displayed]

FINDINGS: No acute bony findings or destructive bony changes are identified.
Degenerative changes are noted. Extensive vascular calcifications.
IMPRESSION: No acute bony findings or destructive bony changes.
